# Patient Record
Sex: Male | Born: 1980
Health system: Southern US, Community
[De-identification: ages and names within clinical notes are randomized; demographics above are authoritative.]

## PROBLEM LIST (undated history)

## (undated) HISTORY — PX: FACIAL FRACTURE SURGERY: SHX1570

---

## 2007-08-15 ENCOUNTER — Emergency Department (HOSPITAL_COMMUNITY): Admission: EM | Admit: 2007-08-15 | Discharge: 2007-08-15 | Payer: Self-pay | Admitting: Emergency Medicine

## 2009-08-19 ENCOUNTER — Emergency Department (HOSPITAL_COMMUNITY): Admission: EM | Admit: 2009-08-19 | Discharge: 2009-08-19 | Payer: Self-pay | Admitting: Emergency Medicine

## 2009-09-02 ENCOUNTER — Ambulatory Visit (HOSPITAL_COMMUNITY): Admission: RE | Admit: 2009-09-02 | Discharge: 2009-09-03 | Payer: Self-pay | Admitting: Otolaryngology

## 2010-11-09 LAB — CBC
HCT: 45.3 % (ref 39.0–52.0)
Hemoglobin: 15.6 g/dL (ref 13.0–17.0)
MCHC: 34.6 g/dL (ref 30.0–36.0)
MCV: 87.2 fL (ref 78.0–100.0)
Platelets: 294 10*3/uL (ref 150–400)
WBC: 6.3 10*3/uL (ref 4.0–10.5)

## 2011-08-30 ENCOUNTER — Emergency Department (HOSPITAL_COMMUNITY)
Admission: EM | Admit: 2011-08-30 | Discharge: 2011-08-31 | Disposition: A | Discharge status: Home / Self Care | Payer: Self-pay | Attending: Emergency Medicine | Admitting: Emergency Medicine

## 2011-08-30 ENCOUNTER — Emergency Department (HOSPITAL_COMMUNITY): Payer: Self-pay

## 2011-08-30 DIAGNOSIS — S61459A Open bite of unspecified hand, initial encounter: Secondary | ICD-10-CM

## 2011-08-30 DIAGNOSIS — F17200 Nicotine dependence, unspecified, uncomplicated: Secondary | ICD-10-CM | POA: Insufficient documentation

## 2011-08-30 DIAGNOSIS — S92309A Fracture of unspecified metatarsal bone(s), unspecified foot, initial encounter for closed fracture: Secondary | ICD-10-CM | POA: Insufficient documentation

## 2011-08-30 DIAGNOSIS — Y9229 Other specified public building as the place of occurrence of the external cause: Secondary | ICD-10-CM | POA: Insufficient documentation

## 2011-08-30 DIAGNOSIS — S61409A Unspecified open wound of unspecified hand, initial encounter: Secondary | ICD-10-CM | POA: Insufficient documentation

## 2011-08-30 MED ORDER — AMOXICILLIN-POT CLAVULANATE 875-125 MG PO TABS: 1.0000 | ORAL_TABLET | Freq: Two times a day (BID) | ORAL | Status: AC

## 2011-08-30 MED ORDER — AMOXICILLIN-POT CLAVULANATE 875-125 MG PO TABS
1.0000 | ORAL_TABLET | Freq: Once | ORAL | Status: AC
  Administered 2011-08-30: 1 via ORAL
  Filled 2011-08-30: qty 1

## 2011-08-30 MED ORDER — OXYCODONE-ACETAMINOPHEN 5-325 MG PO TABS: 2.0000 | ORAL_TABLET | ORAL | Status: AC | PRN

## 2011-08-30 MED ORDER — OXYCODONE-ACETAMINOPHEN 5-325 MG PO TABS
1.0000 | ORAL_TABLET | Freq: Once | ORAL | Status: AC
  Administered 2011-08-30: 1 via ORAL
  Filled 2011-08-30: qty 1
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
## 2011-08-30 NOTE — ED Notes (Signed)
Dr.Knapp at bedside  
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
## 2011-08-30 NOTE — ED Notes (Signed)
Right hand with swelling and bruising noted across hand, c/o pain and numbness at times to right 4th finger

## 2011-08-30 NOTE — ED Notes (Signed)
Patient transported to X-ray 

## 2011-09-02 ENCOUNTER — Ambulatory Visit: Payer: Self-pay | Admitting: Orthopedic Surgery

## 2011-09-02 ENCOUNTER — Encounter: Payer: Self-pay | Admitting: Orthopedic Surgery

## 2013-04-28 ENCOUNTER — Encounter (HOSPITAL_COMMUNITY): Payer: Self-pay | Admitting: Emergency Medicine

## 2013-04-28 ENCOUNTER — Emergency Department (HOSPITAL_COMMUNITY)
Admission: EM | Admit: 2013-04-28 | Discharge: 2013-04-28 | Disposition: A | Discharge status: Home / Self Care | Payer: Self-pay | Attending: Emergency Medicine | Admitting: Emergency Medicine

## 2013-04-28 DIAGNOSIS — Y939 Activity, unspecified: Secondary | ICD-10-CM | POA: Insufficient documentation

## 2013-04-28 DIAGNOSIS — S81009A Unspecified open wound, unspecified knee, initial encounter: Secondary | ICD-10-CM | POA: Insufficient documentation

## 2013-04-28 DIAGNOSIS — F17200 Nicotine dependence, unspecified, uncomplicated: Secondary | ICD-10-CM | POA: Insufficient documentation

## 2013-04-28 DIAGNOSIS — Y929 Unspecified place or not applicable: Secondary | ICD-10-CM | POA: Insufficient documentation

## 2013-04-28 DIAGNOSIS — S81811A Laceration without foreign body, right lower leg, initial encounter: Secondary | ICD-10-CM

## 2013-04-28 DIAGNOSIS — W268XXA Contact with other sharp object(s), not elsewhere classified, initial encounter: Secondary | ICD-10-CM | POA: Insufficient documentation

## 2013-04-28 MED ORDER — TETANUS-DIPHTH-ACELL PERTUSSIS 5-2.5-18.5 LF-MCG/0.5 IM SUSP
0.5000 mL | Freq: Once | INTRAMUSCULAR | Status: AC
  Administered 2013-04-28: 0.5 mL via INTRAMUSCULAR

## 2013-04-28 MED ORDER — TETANUS-DIPHTH-ACELL PERTUSSIS 5-2.5-18.5 LF-MCG/0.5 IM SUSP
INTRAMUSCULAR | Status: AC
  Administered 2013-04-28: 0.5 mL via INTRAMUSCULAR
  Filled 2013-04-28: qty 0.5

## 2013-04-28 MED ORDER — HYDROCODONE-ACETAMINOPHEN 5-325 MG PO TABS: 1.0000 | ORAL_TABLET | ORAL | Status: DC | PRN

## 2013-04-28 MED ORDER — BACITRACIN-NEOMYCIN-POLYMYXIN OINTMENT TUBE
TOPICAL_OINTMENT | Freq: Once | CUTANEOUS | Status: AC
  Administered 2013-04-28: 1 via TOPICAL
  Filled 2013-04-28: qty 15

## 2013-04-28 MED ORDER — BACITRACIN-NEOMYCIN-POLYMYXIN 400-5-5000 EX OINT
TOPICAL_OINTMENT | CUTANEOUS | Status: AC
  Administered 2013-04-28: 1 via TOPICAL
  Filled 2013-04-28: qty 1

## 2013-04-28 MED ORDER — CEPHALEXIN 500 MG PO CAPS
500.0000 mg | ORAL_CAPSULE | Freq: Once | ORAL | Status: AC
  Administered 2013-04-28: 500 mg via ORAL
  Filled 2013-04-28: qty 1

## 2013-04-28 MED ORDER — CEPHALEXIN 500 MG PO CAPS: 500.0000 mg | ORAL_CAPSULE | Freq: Four times a day (QID) | ORAL | Status: DC
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
## 2013-05-04 NOTE — ED Provider Notes (Signed)
Medical screening examination/treatment/procedure(s) were performed by non-physician practitioner and as supervising physician I was immediately available for consultation/collaboration.   Glynn Octave, MD 05/04/13 1231

## 2013-12-12 ENCOUNTER — Emergency Department (HOSPITAL_COMMUNITY): Payer: Self-pay

## 2013-12-12 ENCOUNTER — Emergency Department (HOSPITAL_COMMUNITY)
Admission: EM | Admit: 2013-12-12 | Discharge: 2013-12-12 | Disposition: A | Discharge status: Home / Self Care | Payer: Self-pay | Attending: Emergency Medicine | Admitting: Emergency Medicine

## 2013-12-12 ENCOUNTER — Encounter (HOSPITAL_COMMUNITY): Payer: Self-pay | Admitting: Emergency Medicine

## 2013-12-12 DIAGNOSIS — Y929 Unspecified place or not applicable: Secondary | ICD-10-CM | POA: Insufficient documentation

## 2013-12-12 DIAGNOSIS — S62319A Displaced fracture of base of unspecified metacarpal bone, initial encounter for closed fracture: Secondary | ICD-10-CM | POA: Insufficient documentation

## 2013-12-12 DIAGNOSIS — IMO0002 Reserved for concepts with insufficient information to code with codable children: Secondary | ICD-10-CM | POA: Insufficient documentation

## 2013-12-12 DIAGNOSIS — F17200 Nicotine dependence, unspecified, uncomplicated: Secondary | ICD-10-CM | POA: Insufficient documentation

## 2013-12-12 DIAGNOSIS — Y9389 Activity, other specified: Secondary | ICD-10-CM | POA: Insufficient documentation

## 2013-12-12 DIAGNOSIS — S62309A Unspecified fracture of unspecified metacarpal bone, initial encounter for closed fracture: Secondary | ICD-10-CM

## 2013-12-12 MED ORDER — IBUPROFEN 800 MG PO TABS
800.0000 mg | ORAL_TABLET | Freq: Once | ORAL | Status: AC
  Administered 2013-12-12: 800 mg via ORAL
  Filled 2013-12-12: qty 1

## 2013-12-12 MED ORDER — HYDROCODONE-ACETAMINOPHEN 5-325 MG PO TABS: 1.0000 | ORAL_TABLET | ORAL | Status: DC | PRN

## 2013-12-12 MED ORDER — HYDROCODONE-ACETAMINOPHEN 5-325 MG PO TABS
1.0000 | ORAL_TABLET | Freq: Once | ORAL | Status: AC
  Administered 2013-12-12: 1 via ORAL
  Filled 2013-12-12: qty 1
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
## 2013-12-12 NOTE — Discharge Instructions (Signed)
Boxer's Fracture You have a break (fracture) of the fifth metacarpal bone. This is commonly called a boxer's fracture. This is the bone in the hand where the little finger attaches. The fracture is in the end of that bone, closest to the little finger. It is usually caused when you hit an object with a clenched fist. Often, the knuckle is pushed down by the impact. Sometimes, the fracture rotates out of position. A boxer's fracture will usually heal within 6 weeks, if it is treated properly and protected from re-injury. Surgery is sometimes needed. A cast, splint, or bulky hand dressing may be used to protect and immobilize a boxer's fracture. Do not remove this device or dressing until your caregiver approves. Keep your hand elevated, and apply ice packs for 15-20 minutes every 2 hours, for the first 2 days. Elevation and ice help reduce swelling and relieve pain. See your caregiver, or an orthopedic specialist, for follow-up care within the next 5 days. This is to make sure your fracture is healing properly. Document Released: 08/10/2005 Document Revised: 11/02/2011 Document Reviewed: 01/28/2007 [facility] Patient Information 2014 [place], [place].

## 2013-12-12 NOTE — ED Provider Notes (Signed)
CSN: [id number]     Arrival date & time 12/12/13  [id number] History   First MD Initiated Contact with Patient 12/12/13 [phone]     Chief Complaint  Patient presents with  . Hand Pain     (Consider location/radiation/quality/duration/timing/severity/associated sxs/prior Treatment) HPI Comments: Eddie Johns is a 33 y.o. Male presenting with pain and swelling of his right lateral hand which was the result of punching a wall 2 days ago.  He denies weakness or numbness in the hand and can move his fingers with minimal pain, but attempting to make a fist is very painful. He reports similar injury and fracture in his hand approximately 15 years ago.  He has had no medicines or treatments prior to arrival for this injury.  Pain is constant, throbbing and radiates into his wrist which worsens with elevation.     The history is provided by the patient.    History reviewed. No pertinent past medical history. Past Surgical History  Procedure Laterality Date  . Facial fracture surgery     Family History  Problem Relation Age of Onset  . Diabetes Other    History  Substance Use Topics  . Smoking status: Current Every Day Smoker -- 1.00 packs/day  . Smokeless tobacco: Not on file  . Alcohol Use: Yes     Comment: social    Review of Systems  Constitutional: Negative for fever.  Musculoskeletal: Positive for arthralgias and joint swelling. Negative for myalgias.  Neurological: Negative for weakness and numbness.      Allergies  Review of patient's allergies indicates no known allergies.  Home Medications   Prior to Admission medications   Medication Sig Start Date End Date Taking? Authorizing Provider  cephALEXin (KEFLEX) 500 MG capsule Take 1 capsule (500 mg total) by mouth 4 (four) times daily. 04/28/13   Kathie DikeHobson M Bryant, PA-C  HYDROcodone-acetaminophen (NORCO/VICODIN) 5-325 MG per tablet Take 1 tablet by mouth every 4 (four) hours as needed for pain. 04/28/13   Kathie DikeHobson M Bryant, PA-C    BP 116/94  Pulse 77  Temp(Src) 97.7 F (36.5 C) (Oral)  Resp 18  Ht 5\' 6"  (1.676 m)  SpO2 100% Physical Exam  Constitutional: He appears well-developed and well-nourished.  HENT:  Head: Atraumatic.  Neck: Normal range of motion.  Cardiovascular:  Pulses equal bilaterally  Musculoskeletal:       Right hand: He exhibits decreased range of motion, bony tenderness and swelling. He exhibits normal capillary refill and no deformity. Normal sensation noted. Decreased strength noted. He exhibits no thumb/finger opposition.  Decreased strength noted in fingers secondary to pain.  Less than 3 sec cap refill.  He is most tender over his 4th and 5th metacarpals with moderate edema noted.  Neurological: He is alert. He has normal strength. He displays normal reflexes. No sensory deficit.  Skin: Skin is warm and dry.  Psychiatric: He has a normal mood and affect.    ED Course  Procedures (including critical care time) Labs Review Labs Reviewed - No data to display  Imaging Review Dg Hand Complete Right  12/12/2013   CLINICAL DATA:  Right hand pain  EXAM: RIGHT HAND - COMPLETE 3+ VIEW  COMPARISON:  DG HAND COMPLETE*R* dated 08/30/2011  FINDINGS: There is a nondisplaced fracture of the right fifth metacarpal neck with mild apex dorsal angulation. There is old posttraumatic deformity of the right third metacarpal. There is mild osteoarthritis of the right third MCP joint manifested by a marginal osteophyte of the right  third metacarpal head. There is soft tissue swelling over the right fifth metacarpal.  IMPRESSION: Nondisplaced fracture of the right fifth metacarpal neck with mild apex dorsal angulation.   Electronically Signed   By: Elige KoHetal  Patel   On: 12/12/2013 08:27     EKG Interpretation None      MDM   Final diagnoses:  Fracture of metacarpal bone    Patients labs and/or radiological studies were viewed and considered during the medical decision making and disposition process. Pt  was placed in ulner gutter splint with gentle pressure applied at the site of angulation.  Hydrocodone, ice,  Elevation,  Referral to Dr. Romeo AppleHarrison this week for recheck and cast.  The patient appears reasonably screened and/or stabilized for discharge and I doubt any other medical condition or other [facility] requiring further screening, evaluation, or treatment in the ED at this time prior to discharge.  Splint was examined post application, pain improved,  Patient can wiggle digits, less than 3 sec cap refill.      Burgess AmorJulie Donika Butner, PA-C 12/12/13 [phone]

## 2013-12-13 NOTE — ED Provider Notes (Signed)
Medical screening examination/treatment/procedure(s) were performed by non-physician practitioner and as supervising physician I was immediately available for consultation/collaboration.   EKG Interpretation None        Deantae Shackleton M Kabir Brannock, DO 12/13/13 0710 

## 2014-01-01 ENCOUNTER — Ambulatory Visit: Payer: Self-pay | Admitting: Orthopedic Surgery

## 2014-01-02 ENCOUNTER — Ambulatory Visit (INDEPENDENT_AMBULATORY_CARE_PROVIDER_SITE_OTHER): Payer: Self-pay | Admitting: Orthopedic Surgery

## 2014-01-02 ENCOUNTER — Encounter: Payer: Self-pay | Admitting: Orthopedic Surgery

## 2014-01-02 ENCOUNTER — Ambulatory Visit (INDEPENDENT_AMBULATORY_CARE_PROVIDER_SITE_OTHER): Payer: Self-pay

## 2014-01-02 VITALS — BP 128/87 | Ht 66.0 in | Wt 195.0 lb

## 2014-01-02 DIAGNOSIS — S62609A Fracture of unspecified phalanx of unspecified finger, initial encounter for closed fracture: Secondary | ICD-10-CM

## 2014-01-02 DIAGNOSIS — S62339A Displaced fracture of neck of unspecified metacarpal bone, initial encounter for closed fracture: Secondary | ICD-10-CM

## 2014-01-02 MED ORDER — HYDROCODONE-ACETAMINOPHEN 10-325 MG PO TABS: 1.0000 | ORAL_TABLET | ORAL | Status: DC | PRN

## 2014-01-02 NOTE — Patient Instructions (Signed)
Tape x 3 weeks

## 2014-01-03 DIAGNOSIS — S62339A Displaced fracture of neck of unspecified metacarpal bone, initial encounter for closed fracture: Secondary | ICD-10-CM | POA: Insufficient documentation

## 2014-01-03 NOTE — Progress Notes (Signed)
Patient ID: Eddie DykesMichael G Johns, male   DOB: 1980-10-15, 33 y.o.   MRN: [id number]  Chief Complaint  Patient presents with  . Hand Injury    Right hand fracture. DOI 12-10-13.    HISTORY: 33 year old male who  He has a previous history boxer's fracture he'll wall again sustained another boxer's fracture 4 weeks ago reaction radial this fracture is healing slight angulation. He is self pay. He complains of pain swelling numbness tingling throbbing burning constant pain 10 out of 10 unrelieved by hydrocodone but he does get some relief from the splint  Medical history negative. 2011 had reconstructive eye surgery takes no chronic medications. Her affect no allergies  Family history of stroke diabetes  Review of systems normal except for heartburn    Vital signs: BP 128/87  Ht 5\' 6"  (1.676 m)  Wt 195 lb (88.451 kg)  BMI 31.49 kg/m2   General the patient is well-developed and well-nourished grooming and hygiene are normal Oriented x3 Mood and affect normal Ambulation normal Inspection of the right hand shows swelling tenderness over the distal portion of the fifth metacarpal bone of the right hand he asked he has very good range of motion near full no rotatory deformity stability cannot be assessed grip strength is weak. Skin is intact pulses are normal and sensation is normal.  Encounter Diagnoses  Name Primary?  . Closed fracture of neck of metacarpal bone(s) Yes  . Boxer's metacarpal fracture, neck, closed     Buddy taping. Due to the patient's financial situation if he is not having any difficulty after 2-3 weeks he does not have to come back if he is advised to come back. Hopefully this will save him some money. His fracture stable should not have any displacement.

## 2014-01-10 ENCOUNTER — Telehealth: Payer: Self-pay | Admitting: Radiology

## 2014-01-10 ENCOUNTER — Other Ambulatory Visit: Payer: Self-pay | Admitting: Orthopedic Surgery

## 2014-01-10 MED ORDER — HYDROCODONE-ACETAMINOPHEN 10-325 MG PO TABS: 1.0000 | ORAL_TABLET | ORAL | Status: DC | PRN

## 2014-01-10 NOTE — Telephone Encounter (Signed)
Patient wants refill on Hydrocodone 10/325   [phone]

## 2014-01-12 ENCOUNTER — Other Ambulatory Visit: Payer: Self-pay | Admitting: Orthopedic Surgery

## 2014-01-12 MED ORDER — HYDROCODONE-ACETAMINOPHEN 5-325 MG PO TABS: 1.0000 | ORAL_TABLET | Freq: Four times a day (QID) | ORAL | Status: DC | PRN

## 2014-02-13 ENCOUNTER — Telehealth: Payer: Self-pay | Admitting: Orthopedic Surgery

## 2014-02-13 NOTE — Telephone Encounter (Signed)
Patient called requesting a refill on pain med. He was unsure of the name of med. Patient phone is [phone]

## 2014-02-19 NOTE — Telephone Encounter (Signed)
Patient requesting refill on hydrocodone 5/325 mg. Please advise.

## 2014-02-20 ENCOUNTER — Other Ambulatory Visit: Payer: Self-pay | Admitting: Orthopedic Surgery

## 2014-02-20 MED ORDER — HYDROCODONE-ACETAMINOPHEN 5-325 MG PO TABS: 1.0000 | ORAL_TABLET | Freq: Four times a day (QID) | ORAL | Status: DC | PRN

## 2014-02-20 NOTE — Telephone Encounter (Signed)
Patient aware prescription is ready for pick up

## 2014-04-17 ENCOUNTER — Telehealth: Payer: Self-pay | Admitting: Orthopedic Surgery

## 2014-04-17 NOTE — Telephone Encounter (Signed)
Eddie Johns called 12:20 today to request an appointment for a right hand injury.  Advised we have had to change our schedule this week and only have Dr. Romeo Apple here this afternoon and tomorrow.  Advised him to go to [facility] today to be evaluated and get his xray done so he can get some treatment started Because we would not be able to get him in until next week.

## 2014-05-21 ENCOUNTER — Encounter (HOSPITAL_COMMUNITY): Payer: Self-pay | Admitting: Emergency Medicine

## 2014-05-21 ENCOUNTER — Emergency Department (HOSPITAL_COMMUNITY)
Admission: EM | Admit: 2014-05-21 | Discharge: 2014-05-21 | Disposition: A | Discharge status: Home / Self Care | Payer: Self-pay | Attending: Emergency Medicine | Admitting: Emergency Medicine

## 2014-05-21 ENCOUNTER — Emergency Department (HOSPITAL_COMMUNITY): Payer: Self-pay

## 2014-05-21 DIAGNOSIS — S6990XA Unspecified injury of unspecified wrist, hand and finger(s), initial encounter: Secondary | ICD-10-CM | POA: Insufficient documentation

## 2014-05-21 DIAGNOSIS — Y9289 Other specified places as the place of occurrence of the external cause: Secondary | ICD-10-CM | POA: Insufficient documentation

## 2014-05-21 DIAGNOSIS — S60221A Contusion of right hand, initial encounter: Secondary | ICD-10-CM

## 2014-05-21 DIAGNOSIS — F17200 Nicotine dependence, unspecified, uncomplicated: Secondary | ICD-10-CM | POA: Insufficient documentation

## 2014-05-21 DIAGNOSIS — Z791 Long term (current) use of non-steroidal anti-inflammatories (NSAID): Secondary | ICD-10-CM | POA: Insufficient documentation

## 2014-05-21 DIAGNOSIS — S60229A Contusion of unspecified hand, initial encounter: Secondary | ICD-10-CM | POA: Insufficient documentation

## 2014-05-21 DIAGNOSIS — IMO0002 Reserved for concepts with insufficient information to code with codable children: Secondary | ICD-10-CM | POA: Insufficient documentation

## 2014-05-21 DIAGNOSIS — Y9389 Activity, other specified: Secondary | ICD-10-CM | POA: Insufficient documentation

## 2014-05-21 MED ORDER — HYDROCODONE-ACETAMINOPHEN 5-325 MG PO TABS
1.0000 | ORAL_TABLET | Freq: Once | ORAL | Status: AC
  Administered 2014-05-21: 1 via ORAL
  Filled 2014-05-21: qty 1

## 2014-05-21 MED ORDER — HYDROCODONE-ACETAMINOPHEN 5-325 MG PO TABS: ORAL_TABLET | ORAL | Status: DC

## 2014-05-21 MED ORDER — IBUPROFEN 800 MG PO TABS: 800.0000 mg | ORAL_TABLET | Freq: Three times a day (TID) | ORAL | Status: DC

## 2014-05-21 NOTE — Discharge Instructions (Signed)
Contusion A contusion is a deep bruise. Contusions happen when an injury causes bleeding under the skin. Signs of bruising include pain, puffiness (swelling), and discolored skin. The contusion may turn blue, purple, or yellow. HOME CARE   Put ice on the injured area.  Put ice in a plastic bag.  Place a towel between your skin and the bag.  Leave the ice on for 15-20 minutes, 03-04 times a day.  Only take medicine as told by your doctor.  Rest the injured area.  If possible, raise (elevate) the injured area to lessen puffiness. GET HELP RIGHT AWAY IF:   You have more bruising or puffiness.  You have pain that is getting worse.  Your puffiness or pain is not helped by medicine. MAKE SURE YOU:   Understand these instructions.  Will watch your condition.  Will get help right away if you are not doing well or get worse. Document Released: 01/27/2008 Document Revised: 11/02/2011 Document Reviewed: 06/15/2011 [facility] Patient Information 2015 [place], [place]. This information is not intended to replace advice given to you by your health care provider. Make sure you discuss any questions you have with your health care provider.  Hand Contusion  A hand contusion is a deep bruise to the hand. Contusions happen when an injury causes bleeding under the skin. Signs of bruising include pain, puffiness (swelling), and discolored skin. The contusion may turn blue, purple, or yellow. HOME CARE  Put ice on the injured area.  Put ice in a plastic bag.  Place a towel between your skin and the bag.  Leave the ice on for 15-20 minutes, 03-04 times a day.  Only take medicines as told by your doctor.  Use an elastic wrap only as told. You may remove the wrap for sleeping, showering, and bathing. Take the wrap off if you lose feeling (have numbness) in your fingers, or they turn blue or cold. Put the wrap on more loosely.  Keep the hand raised (elevated) with pillows.  Avoid using your  hand too much if it is painful. GET HELP RIGHT AWAY IF:   You have more redness, puffiness, or pain in your hand.  Your puffiness or pain does not get better with medicine.  You lose feeling in your hand, or you cannot move your fingers.  Your hand turns cold or blue.  You have pain when you move your fingers.  Your hand feels warm.  Your contusion does not get better in 2 days. MAKE SURE YOU:   Understand these instructions.  Will watch this condition.  Will get help right away if you are not doing well or you get worse. Document Released: 01/27/2008 Document Revised: 12/25/2013 Document Reviewed: 02/01/2012 [facility] Patient Information 2015 [place], [place]. This information is not intended to replace advice given to you by your health care provider. Make sure you discuss any questions you have with your health care provider.

## 2014-05-21 NOTE — ED Notes (Signed)
Pt c/o right hand pain after closing it in car door today; pt has positive radial pulse; pt has swelling to right hand

## 2014-05-21 NOTE — ED Notes (Signed)
Rt hand swollen, ice pack applied.  3 and 4th fingers buddy taped, ace wrap.

## 2014-05-23 NOTE — ED Provider Notes (Signed)
CSN: [id number]     Arrival date & time 05/21/14  1934 History   First MD Initiated Contact with Patient 05/21/14 2130     Chief Complaint  Patient presents with  . Hand Injury     (Consider location/radiation/quality/duration/timing/severity/associated sxs/prior Treatment) HPI  Eddie Johns is a 33 y.o. male who presents to the Emergency Department complaining of pain to his right hand after a direct blow.  He states that he accidentally shut the car door on his hand.  He reports a previous fracture to the same hand and is concerned that he may have re injured the hand.  He reports immediate swelling ot his hand and pain with movement of the third and fourth fingers.  He has not tried any therapies prior to arrival.  He also denies open wounds, numbness or weakness of the hand or pain to the wrist.     History reviewed. No pertinent past medical history. Past Surgical History  Procedure Laterality Date  . Facial fracture surgery     Family History  Problem Relation Age of Onset  . Diabetes Other    History  Substance Use Topics  . Smoking status: Current Every Day Smoker -- 1.00 packs/day  . Smokeless tobacco: Not on file  . Alcohol Use: Yes     Comment: social    Review of Systems  Constitutional: Negative for fever and chills.  Genitourinary: Negative for dysuria and difficulty urinating.  Musculoskeletal: Positive for arthralgias and joint swelling.       Right hand pain and swelling  Skin: Negative for color change and wound.  All other systems reviewed and are negative.     Allergies  Review of patient's allergies indicates no known allergies.  Home Medications   Prior to Admission medications   Medication Sig Start Date End Date Taking? Authorizing Provider  tetrahydrozoline (VISINE) 0.05 % ophthalmic solution Place 1 drop into both eyes daily as needed (for dry eye/irritation).   Yes Historical Provider, MD  HYDROcodone-acetaminophen (NORCO/VICODIN) 5-325  MG per tablet Take one-two tabs po q 4-6 hrs prn pain 05/21/14   Michole Lecuyer L. Terryn Rosenkranz, PA-C  ibuprofen (ADVIL,MOTRIN) 800 MG tablet Take 1 tablet (800 mg total) by mouth 3 (three) times daily. 05/21/14   Vicie Cech L. Trysten Berti, PA-C   BP 128/80  Pulse 86  Temp(Src) 99.1 F (37.3 C) (Oral)  Resp 20  Ht 5\' 6"  (1.676 m)  Wt 180 lb (81.647 kg)  BMI 29.07 kg/m2  SpO2 100% Physical Exam  Nursing note and vitals reviewed. Constitutional: He is oriented to person, place, and time. He appears well-developed and well-nourished. No distress.  HENT:  Head: Normocephalic and atraumatic.  Cardiovascular: Normal rate, regular rhythm, normal heart sounds and intact distal pulses.   No murmur heard. Pulmonary/Chest: Effort normal and breath sounds normal. No respiratory distress.  Musculoskeletal: He exhibits edema and tenderness.  Localized ttp of the dorsal right hand at the proximal third and fourth fingers.  Moderate STS present.   Radial pulse is brisk, distal sensation intact.  CR< 2 sec.  No bruising or bony deformity.  No proximal tenderness or edema.  Compartments of the right UE are soft.  Neurological: He is alert and oriented to person, place, and time. He exhibits normal muscle tone. Coordination normal.  Skin: Skin is warm and dry.    ED Course  Procedures (including critical care time) Labs Review Labs Reviewed - No data to display  Imaging Review Dg Hand Complete Right  05/21/2014   CLINICAL DATA:  Slammed right hand in car door. Injury to fourth finger.  EXAM: RIGHT HAND - COMPLETE 3+ VIEW  COMPARISON:  01/02/2014  FINDINGS: Evidence of old third and fifth metacarpal fractures. No evidence of acute fracture or dislocation.  IMPRESSION: No acute findings.   Electronically Signed   By: Elberta Fortis M.D.   On: 05/21/2014 20:43    EKG Interpretation None      MDM   Final diagnoses:  Contusion, hand, right, initial encounter    Bulking dressing applied, third and fourth fingers were  splint for comfort, pain improved , remains NV intact.   Patient agrees to elevate, ice and orthopedic f/u in one week if sx's not improving.  Pt agrees to plan and appears stable for d/c    Tiffine Henigan L. Mischa Brittingham, PA-C 05/23/14 2317

## 2014-05-24 ENCOUNTER — Ambulatory Visit (INDEPENDENT_AMBULATORY_CARE_PROVIDER_SITE_OTHER): Payer: Self-pay | Admitting: Orthopedic Surgery

## 2014-05-24 VITALS — BP 125/90 | Ht 66.0 in | Wt 180.0 lb

## 2014-05-24 DIAGNOSIS — S60221A Contusion of right hand, initial encounter: Secondary | ICD-10-CM

## 2014-05-24 MED ORDER — OXYCODONE-ACETAMINOPHEN 5-325 MG PO TABS: 1.0000 | ORAL_TABLET | ORAL | Status: DC | PRN

## 2014-05-24 MED ORDER — IBUPROFEN 800 MG PO TABS: 800.0000 mg | ORAL_TABLET | Freq: Three times a day (TID) | ORAL | Status: DC

## 2014-05-24 NOTE — ED Provider Notes (Signed)
Medical screening examination/treatment/procedure(s) were performed by non-physician practitioner and as supervising physician I was immediately available for consultation/collaboration.   EKG Interpretation None        Shaquanta Harkless, MD 05/24/14 0701 

## 2014-05-24 NOTE — Patient Instructions (Signed)
Take meds as ordered   Move fingers

## 2014-05-24 NOTE — Progress Notes (Signed)
Subjective:   Chief Complaint  Patient presents with  . Hand Injury    reinjury of right hand     Eddie Johns is a 33 y.o. male who presents for evaluation of right hand/finger pain. Onset was Sudden after his hand was closed in a car door on September 28. The pain is severe, worsens with movement, and is relieved by nothing yet including oral pain medication. There is no associated numbness, tingling in and. Evaluation to date: Emergency room x-rays negative for acute fracture. Treatment to date: ice, Buddy taping and oral pain medicine.  The following portions of the patient's history were reviewed and updated as appropriate: allergies, current medications, past family history, past medical history, past social history, past surgical history and problem list.  Review of Systems A comprehensive review of systems was negative.    Objective:    BP 125/90  Ht 5\' 6"  (1.676 m)  Wt 180 lb (81.647 kg)  BMI 29.07 kg/m2       General appearance is normal. Orientation to person place and time normal. Mood and affect normal. Gait and station normal. Right hand evaluation has swelling on the dorsum of the hand and it involves the third and fourth metacarpals. No deformity is seen. Has restricted range of motion secondary to pain and swelling. The metacarpophalangeal joints are stable strength is weakened by the pain the skin is intact his pulses couldn't radial artery has good capillary refill and there are no sensory deficits Imaging: X-ray right hand: soft tissue swelling    Assessment:    Severe contusion right hand    Plan:    Active range of motion, ibuprofen and Percocet followup when necessary

## 2014-05-30 ENCOUNTER — Other Ambulatory Visit: Payer: Self-pay | Admitting: *Deleted

## 2014-05-30 ENCOUNTER — Telehealth: Payer: Self-pay | Admitting: Orthopedic Surgery

## 2014-05-30 DIAGNOSIS — S60221A Contusion of right hand, initial encounter: Secondary | ICD-10-CM

## 2014-05-30 MED ORDER — OXYCODONE-ACETAMINOPHEN 5-325 MG PO TABS: 1.0000 | ORAL_TABLET | ORAL | Status: DC | PRN

## 2014-05-30 NOTE — Telephone Encounter (Signed)
Patient called, requests pain medication refill, HYDROcodone-acetaminophen (NORCO/VICODIN) 5-325 MG per tablet [[id number]][[id number]]. He is to follow up as needed, per 05/24/14 office visit.  Ph# [phone].

## 2014-05-31 NOTE — Telephone Encounter (Signed)
Prescription available for pick up, called patient, no answer, left vm 

## 2014-06-06 NOTE — Telephone Encounter (Signed)
Patient had been by and picked up prescription 06/01/14.

## 2014-06-14 ENCOUNTER — Telehealth: Payer: Self-pay | Admitting: *Deleted

## 2014-06-14 NOTE — Telephone Encounter (Signed)
Routing to Dr Romeo AppleHarrison This was a hand contusion with no follow up

## 2014-06-14 NOTE — Telephone Encounter (Signed)
Patient called requesting refill on oxycodone 5/325 mg. Also, he is wanting to speak with the nurse regarding his hand swelling. Patient's number [phone].

## 2014-06-18 NOTE — Telephone Encounter (Signed)
FINDINGS:  Evidence of old third and fifth metacarpal fractures. No evidence of  acute fracture or dislocation.  IMPRESSION:  No acute findings.  Electronically Signed  By: Elberta Fortisaniel Boyle M.D.  On: 05/21/2014 20:43 I have reviewed the patient's clinical history and x-rays. The patient can take Tylenol or ibuprofen apply ice as needed and elevate the hand as needed.

## 2014-06-18 NOTE — Telephone Encounter (Signed)
Called patient, not available, left message to return call 

## 2016-05-14 ENCOUNTER — Encounter: Payer: Self-pay | Admitting: Family

## 2016-05-14 ENCOUNTER — Ambulatory Visit (INDEPENDENT_AMBULATORY_CARE_PROVIDER_SITE_OTHER): Payer: BLUE CROSS/BLUE SHIELD | Admitting: Family

## 2016-05-14 VITALS — BP 113/70 | Temp 98.4°F | Ht 66.0 in | Wt 190.2 lb

## 2016-05-14 DIAGNOSIS — E669 Obesity, unspecified: Secondary | ICD-10-CM | POA: Diagnosis not present

## 2016-05-14 DIAGNOSIS — Z0000 Encounter for general adult medical examination without abnormal findings: Secondary | ICD-10-CM

## 2016-05-14 DIAGNOSIS — Z114 Encounter for screening for human immunodeficiency virus [HIV]: Secondary | ICD-10-CM | POA: Diagnosis not present

## 2016-05-14 DIAGNOSIS — M79604 Pain in right leg: Secondary | ICD-10-CM | POA: Diagnosis not present

## 2016-05-14 MED ORDER — NAPROXEN 500 MG PO TABS: 500.0000 mg | ORAL_TABLET | Freq: Two times a day (BID) | ORAL | 1 refills | Status: DC

## 2016-05-14 NOTE — Progress Notes (Signed)
   Subjective:    Patient ID: Eddie Johns, male    DOB: 06-14-1981, 35 y.o.   MRN: [id number]  HPI Pt presents to the office today to establish care. PT is currently not taking any medications at this time. Pt states he is having intermittent right lower leg pain that is a 8-9 out 10. PT states in 2014 he has an laceration to that area. Pt denies any headache, palpitations, SOB, or edema at this time.     Review of Systems  Musculoskeletal: Positive for arthralgias (rightl lower leg).  All other systems reviewed and are negative.      Objective:   Physical Exam  Constitutional: He is oriented to person, place, and time. He appears well-developed and well-nourished. No distress.  HENT:  Head: Normocephalic.  Right Ear: External ear normal.  Left Ear: External ear normal.  Nose: Nose normal.  Mouth/Throat: Oropharynx is clear and moist.  Eyes: Pupils are equal, round, and reactive to light. Right eye exhibits no discharge. Left eye exhibits no discharge.  Neck: Normal range of motion. Neck supple. No thyromegaly present.  Cardiovascular: Normal rate, regular rhythm, normal heart sounds and intact distal pulses.   No murmur heard. Pulmonary/Chest: Effort normal and breath sounds normal. No respiratory distress. He has no wheezes.  Abdominal: Soft. Bowel sounds are normal. He exhibits no distension. There is no tenderness.  Musculoskeletal: Normal range of motion. He exhibits no edema or tenderness.  Neurological: He is alert and oriented to person, place, and time. He has normal reflexes. No cranial nerve deficit.  Skin: Skin is warm and dry. No rash noted. No erythema.  Psychiatric: He has a normal mood and affect. His behavior is normal. Judgment and thought content normal.  Vitals reviewed.     BP 113/70   Temp 98.4 F (36.9 C) (Oral)   Ht '5\' 6"'$  (1.676 m)   Wt 190 lb 3.2 oz (86.3 kg)   BMI 30.70 kg/m      Assessment & Plan:  1. Annual physical exam -  CMP14+EGFR - Lipid panel - CBC with Differential/Platelet - Thyroid Panel With TSH - VITAMIN D 25 Hydroxy (Vit-D Deficiency, Fractures) - HIV antibody  2. Obesity (BMI 30-39.9) - CMP14+EGFR  3. Encounter for screening for HIV - CMP14+EGFR - HIV antibody  4. Pain of right lower extremity - CMP14+EGFR - naproxen (NAPROSYN) 500 MG tablet; Take 1 tablet (500 mg total) by mouth 2 (two) times daily with a meal.  Dispense: 60 tablet; Refill: 1   Continue all meds Labs pending Health Maintenance reviewed Diet and exercise encouraged RTO 1 year  Evelina Dun, FNP

## 2016-05-14 NOTE — Patient Instructions (Signed)

## 2016-05-15 ENCOUNTER — Other Ambulatory Visit: Payer: Self-pay | Admitting: Family

## 2016-05-15 DIAGNOSIS — E559 Vitamin D deficiency, unspecified: Secondary | ICD-10-CM

## 2016-05-15 LAB — CBC WITH DIFFERENTIAL/PLATELET
Basophils Absolute: 0 10*3/uL (ref 0.0–0.2)
Basos: 0 %
EOS (ABSOLUTE): 0.5 10*3/uL — ABNORMAL HIGH (ref 0.0–0.4)
Eos: 4 %
HEMOGLOBIN: 14.5 g/dL (ref 12.6–17.7)
Hematocrit: 42.8 % (ref 37.5–51.0)
Immature Grans (Abs): 0 10*3/uL (ref 0.0–0.1)
Immature Granulocytes: 0 %
Lymphocytes Absolute: 3.5 10*3/uL — ABNORMAL HIGH (ref 0.7–3.1)
Lymphs: 30 %
MCH: 28.2 pg (ref 26.6–33.0)
MCHC: 33.9 g/dL (ref 31.5–35.7)
MCV: 83 fL (ref 79–97)
MONOCYTES: 6 %
Monocytes Absolute: 0.8 10*3/uL (ref 0.1–0.9)
NEUTROS ABS: 6.9 10*3/uL (ref 1.4–7.0)
Neutrophils: 60 %
Platelets: 384 10*3/uL — ABNORMAL HIGH (ref 150–379)
RBC: 5.14 x10E6/uL (ref 4.14–5.80)
RDW: 14.3 % (ref 12.3–15.4)
WBC: 11.6 10*3/uL — ABNORMAL HIGH (ref 3.4–10.8)

## 2016-05-15 LAB — CMP14+EGFR
ALT: 13 IU/L (ref 0–44)
AST: 14 IU/L (ref 0–40)
Albumin/Globulin Ratio: 1.7 (ref 1.2–2.2)
Albumin: 4.7 g/dL (ref 3.5–5.5)
Alkaline Phosphatase: 96 IU/L (ref 39–117)
BUN/Creatinine Ratio: 12 (ref 9–20)
BUN: 12 mg/dL (ref 6–20)
Bilirubin Total: 0.4 mg/dL (ref 0.0–1.2)
CALCIUM: 10.6 mg/dL — AB (ref 8.7–10.2)
CO2: 23 mmol/L (ref 18–29)
Chloride: 100 mmol/L (ref 96–106)
Creatinine, Ser: 1 mg/dL (ref 0.76–1.27)
GFR calc Af Amer: 112 mL/min/{1.73_m2} (ref >59)
GFR calc non Af Amer: 97 mL/min/{1.73_m2} (ref >59)
GLUCOSE: 79 mg/dL (ref 65–99)
Globulin, Total: 2.8 g/dL (ref 1.5–4.5)
POTASSIUM: 3.8 mmol/L (ref 3.5–5.2)
Sodium: 139 mmol/L (ref 134–144)
Total Protein: 7.5 g/dL (ref 6.0–8.5)

## 2016-05-15 LAB — THYROID PANEL WITH TSH
Free Thyroxine Index: 1.6 (ref 1.2–4.9)
T3 Uptake Ratio: 25 % (ref 24–39)
T4, Total: 6.3 ug/dL (ref 4.5–12.0)
TSH: 2.55 u[IU]/mL (ref 0.450–4.500)

## 2016-05-15 LAB — LIPID PANEL
Chol/HDL Ratio: 2.8 ratio units (ref 0.0–5.0)
Cholesterol, Total: 170 mg/dL (ref 100–199)
HDL: 61 mg/dL (ref >39)
LDL Calculated: 82 mg/dL (ref 0–99)
TRIGLYCERIDES: 135 mg/dL (ref 0–149)
VLDL Cholesterol Cal: 27 mg/dL (ref 5–40)

## 2016-05-15 LAB — HIV ANTIBODY (ROUTINE TESTING W REFLEX): HIV Screen 4th Generation wRfx: NONREACTIVE

## 2016-05-15 LAB — VITAMIN D 25 HYDROXY (VIT D DEFICIENCY, FRACTURES): Vit D, 25-Hydroxy: 6.3 ng/mL — ABNORMAL LOW (ref 30.0–100.0)

## 2016-05-15 MED ORDER — VITAMIN D (ERGOCALCIFEROL) 1.25 MG (50000 UNIT) PO CAPS: 50000.0000 [IU] | ORAL_CAPSULE | ORAL | 3 refills | Status: DC

## 2016-09-01 ENCOUNTER — Telehealth: Payer: Self-pay | Admitting: Family

## 2016-09-04 ENCOUNTER — Ambulatory Visit (INDEPENDENT_AMBULATORY_CARE_PROVIDER_SITE_OTHER): Payer: BLUE CROSS/BLUE SHIELD | Admitting: Family

## 2016-09-04 ENCOUNTER — Encounter: Payer: Self-pay | Admitting: Family

## 2016-09-04 VITALS — BP 129/81 | HR 90 | Temp 99.7°F | Ht 66.0 in | Wt 184.0 lb

## 2016-09-04 DIAGNOSIS — G8929 Other chronic pain: Secondary | ICD-10-CM | POA: Diagnosis not present

## 2016-09-04 DIAGNOSIS — Z0289 Encounter for other administrative examinations: Secondary | ICD-10-CM

## 2016-09-04 DIAGNOSIS — M79641 Pain in right hand: Secondary | ICD-10-CM

## 2016-09-04 DIAGNOSIS — F1120 Opioid dependence, uncomplicated: Secondary | ICD-10-CM

## 2016-09-04 DIAGNOSIS — F17200 Nicotine dependence, unspecified, uncomplicated: Secondary | ICD-10-CM | POA: Diagnosis not present

## 2016-09-04 MED ORDER — TRAMADOL HCL 50 MG PO TABS: 50.0000 mg | ORAL_TABLET | Freq: Two times a day (BID) | ORAL | 2 refills | Status: DC | PRN

## 2016-09-04 NOTE — Progress Notes (Signed)
   Subjective:    Patient ID: Eddie Johns, male    DOB: Sep 28, 1980, 36 y.o.   MRN: [id number]  PT presents to the office today with recurrent right hand pain that started several years ago. PT fractured his third and fifth metacarpal in 1998 and has since had intermittent aching pain of 9 out 10. Pt states at his job he has to constantly use that hand. Pt states he has tried naproxen with no relief.  Hand Pain   The pain is present in the right hand. The quality of the pain is described as aching. The pain is at a severity of 9/10. The pain is moderate. The pain has been intermittent since the incident. He has tried rest for the symptoms. The treatment provided mild relief.    *Pt reviewed in [place] controlled database, pt has not received any controlled substances recently.   Review of Systems  All other systems reviewed and are negative.      Objective:   Physical Exam  Constitutional: He is oriented to person, place, and time. He appears well-developed and well-nourished. No distress.  HENT:  Head: Normocephalic.  Eyes: Pupils are equal, round, and reactive to light. Right eye exhibits no discharge. Left eye exhibits no discharge.  Neck: Normal range of motion. Neck supple. No thyromegaly present.  Cardiovascular: Normal rate, regular rhythm, normal heart sounds and intact distal pulses.   No murmur heard. Pulmonary/Chest: Effort normal and breath sounds normal. No respiratory distress. He has no wheezes.  Abdominal: Soft. Bowel sounds are normal. He exhibits no distension. There is no tenderness.  Musculoskeletal: Normal range of motion. He exhibits edema (right knuckle on first metacarpal enlarged) and tenderness.  Neurological: He is alert and oriented to person, place, and time. He has normal reflexes. No cranial nerve deficit.  Skin: Skin is warm and dry. No rash noted. No erythema.  Psychiatric: He has a normal mood and affect. His behavior is normal. Judgment and thought  content normal.  Vitals reviewed.    BP 129/81   Pulse 90   Temp 99.7 F (37.6 C) (Oral)   Ht 5\' 6"  (1.676 m)   Wt 184 lb (83.5 kg)   BMI 29.70 kg/m       Assessment & Plan:  1. Chronic hand pain, right - traMADol (ULTRAM) 50 MG tablet; Take 1-2 tablets (50-100 mg total) by mouth every 12 (twelve) hours as needed.  Dispense: 90 tablet; Refill: 2 - ToxASSURE Select 13 (MW), Urine  2. Current smoker -Smoking cessation discussed - traMADol (ULTRAM) 50 MG tablet; Take 1-2 tablets (50-100 mg total) by mouth every 12 (twelve) hours as needed.  Dispense: 90 tablet; Refill: 2 - ToxASSURE Select 13 (MW), Urine  3. Pain medication agreement signed - traMADol (ULTRAM) 50 MG tablet; Take 1-2 tablets (50-100 mg total) by mouth every 12 (twelve) hours as needed.  Dispense: 90 tablet; Refill: 2 - ToxASSURE Select 13 (MW), Urine  4. Uncomplicated opioid dependence (HCC) - traMADol (ULTRAM) 50 MG tablet; Take 1-2 tablets (50-100 mg total) by mouth every 12 (twelve) hours as needed.  Dispense: 90 tablet; Refill: 2 - ToxASSURE Select 13 (MW), Urine   Continue all meds Labs pending Health Maintenance reviewed Diet and exercise encouraged RTO 3 months   Jannifer Rodneyhristy Keshana Klemz, FNP

## 2016-09-04 NOTE — Patient Instructions (Signed)
Arthritis Introduction Arthritis is a term that is commonly used to refer to joint pain or joint disease. There are more than 100 types of arthritis. What are the causes? The most common cause of this condition is wear and tear of a joint. Other causes include:  Gout.  Inflammation of a joint.  An infection of a joint.  Sprains and other injuries near the joint.  A drug reaction or allergic reaction. In some cases, the cause may not be known. What are the signs or symptoms? The main symptom of this condition is pain in the joint with movement. Other symptoms include:  Redness, swelling, or stiffness at a joint.  Warmth coming from the joint.  Fever.  Overall feeling of illness. How is this diagnosed? This condition may be diagnosed with a physical exam and tests, including:  Blood tests.  Urine tests.  Imaging tests, such as MRI, X-rays, or a CT scan. Sometimes, fluid is removed from a joint for testing. How is this treated? Treatment for this condition may involve:  Treatment of the cause, if it is known.  Rest.  Raising (elevating) the joint.  Applying cold or hot packs to the joint.  Medicines to improve symptoms and reduce inflammation.  Injections of a steroid such as cortisone into the joint to help reduce pain and inflammation. Depending on the cause of your arthritis, you may need to make lifestyle changes to reduce stress on your joint. These changes may include exercising more and losing weight. Follow these instructions at home: Medicines  Take over-the-counter and prescription medicines only as told by your health care provider.  Do not take aspirin to relieve pain if gout is suspected. Activity  Rest your joint if told by your health care provider. Rest is important when your disease is active and your joint feels painful, swollen, or stiff.  Avoid activities that make the pain worse. It is important to balance activity with rest.  Exercise  your joint regularly with range-of-motion exercises as told by your health care provider. Try doing low-impact exercise, such as:  Swimming.  Water aerobics.  Biking.  Walking. Joint Care   If your joint is swollen, keep it elevated if told by your health care provider.  If your joint feels stiff in the morning, try taking a warm shower.  If directed, apply heat to the joint. If you have diabetes, do not apply heat without permission from your health care provider.  Put a towel between the joint and the hot pack or heating pad.  Leave the heat on the area for 20-30 minutes.  If directed, apply ice to the joint:  Put ice in a plastic bag.  Place a towel between your skin and the bag.  Leave the ice on for 20 minutes, 2-3 times per day.  Keep all follow-up visits as told by your health care provider. This is important. Contact a health care provider if:  The pain gets worse.  You have a fever. Get help right away if:  You develop severe joint pain, swelling, or redness.  Many joints become painful and swollen.  You develop severe back pain.  You develop severe weakness in your leg.  You cannot control your bladder or bowels. This information is not intended to replace advice given to you by your health care provider. Make sure you discuss any questions you have with your health care provider. Document Released: 09/17/2004 Document Revised: 01/16/2016 Document Reviewed: 11/05/2014  2017 Elsevier  

## 2016-09-07 DIAGNOSIS — F4325 Adjustment disorder with mixed disturbance of emotions and conduct: Secondary | ICD-10-CM | POA: Diagnosis not present

## 2016-10-01 DIAGNOSIS — F4325 Adjustment disorder with mixed disturbance of emotions and conduct: Secondary | ICD-10-CM | POA: Diagnosis not present

## 2016-11-19 DIAGNOSIS — F4323 Adjustment disorder with mixed anxiety and depressed mood: Secondary | ICD-10-CM | POA: Diagnosis not present

## 2017-01-08 ENCOUNTER — Emergency Department (HOSPITAL_COMMUNITY)
Admission: EM | Admit: 2017-01-08 | Discharge: 2017-01-08 | Disposition: A | Discharge status: Home / Self Care | Payer: BLUE CROSS/BLUE SHIELD | Attending: Emergency Medicine | Admitting: Emergency Medicine

## 2017-01-08 ENCOUNTER — Emergency Department (HOSPITAL_COMMUNITY): Payer: BLUE CROSS/BLUE SHIELD

## 2017-01-08 ENCOUNTER — Encounter (HOSPITAL_COMMUNITY): Payer: Self-pay | Admitting: Emergency Medicine

## 2017-01-08 DIAGNOSIS — M79671 Pain in right foot: Secondary | ICD-10-CM | POA: Diagnosis present

## 2017-01-08 DIAGNOSIS — F17200 Nicotine dependence, unspecified, uncomplicated: Secondary | ICD-10-CM | POA: Insufficient documentation

## 2017-01-08 DIAGNOSIS — L729 Follicular cyst of the skin and subcutaneous tissue, unspecified: Secondary | ICD-10-CM | POA: Insufficient documentation

## 2017-01-08 DIAGNOSIS — M79672 Pain in left foot: Secondary | ICD-10-CM

## 2017-01-08 DIAGNOSIS — S99921A Unspecified injury of right foot, initial encounter: Secondary | ICD-10-CM | POA: Diagnosis not present

## 2017-01-08 DIAGNOSIS — M7989 Other specified soft tissue disorders: Secondary | ICD-10-CM

## 2017-01-08 MED ORDER — TRAMADOL HCL 50 MG PO TABS: ORAL_TABLET | ORAL | 0 refills | Status: DC

## 2017-01-08 MED ORDER — IBUPROFEN 800 MG PO TABS: 800.0000 mg | ORAL_TABLET | Freq: Three times a day (TID) | ORAL | 0 refills | Status: DC

## 2017-01-08 MED ORDER — IBUPROFEN 600 MG PO TABS: 600.0000 mg | ORAL_TABLET | Freq: Four times a day (QID) | ORAL | 0 refills | Status: DC

## 2017-01-08 MED ORDER — IBUPROFEN 800 MG PO TABS
800.0000 mg | ORAL_TABLET | Freq: Once | ORAL | Status: AC
  Administered 2017-01-08: 800 mg via ORAL
  Filled 2017-01-08: qty 1

## 2017-01-08 MED ORDER — ACETAMINOPHEN 325 MG PO TABS
650.0000 mg | ORAL_TABLET | Freq: Once | ORAL | Status: AC
  Administered 2017-01-08: 650 mg via ORAL
  Filled 2017-01-08: qty 2

## 2017-01-08 NOTE — ED Provider Notes (Signed)
[facility]-EMER[facility]ENCY DEPT Provider Note   CSN: [id number] Arrival date & time: 01/08/17  [id number]     History   Chief Complaint Chief Complaint  Patient presents with  . Foot Pain    HPI Patric DykesMichael G Egley is a 36 y.o. male.  Patient is a 36 year old male who presents to the emergency department with right foot pain.  The patient states that approximately one month ago he began to have painful sensation at the bottom of his foot. This problem is gotten progressively worse. He now has pain when he walks or stands. The patient states that he does a lot of standing and walking on his job. He's not had any injury to his foot. He does not recall stepping on anything or sustaining any puncture type wounds. He's not had any fever or chills. There's been no increase redness appreciated. He presents now for assessment and assistance with this problem.   The history is provided by the patient.    History reviewed. No pertinent past medical history.  Patient Active Problem List   Diagnosis Date Noted  . Chronic hand pain, right 09/04/2016  . Current smoker 09/04/2016  . Obesity (BMI 30-39.9) 05/14/2016  . Closed fracture of neck of metacarpal bone(s) 01/03/2014    Past Surgical History:  Procedure Laterality Date  . FACIAL FRACTURE SURGERY         Home Medications    Prior to Admission medications   Medication Sig Start Date End Date Taking? Authorizing Provider  ibuprofen (ADVIL,MOTRIN) 800 MG tablet Take 1 tablet (800 mg total) by mouth 3 (three) times daily. 01/08/17   Ivery QualeBryant, Kitt Minardi, PA-C  traMADol Janean Sark(ULTRAM) 50 MG tablet 1 or 2 po q6h prn pain 01/08/17   Ivery QualeBryant, Brailynn Breth, PA-C  Vitamin D, Ergocalciferol, (DRISDOL) 50000 units CAPS capsule Take 1 capsule (50,000 Units total) by mouth every 7 (seven) days. 05/15/16   Junie SpencerHawks, Christy A, FNP    Family History Family History  Problem Relation Age of Onset  . Diabetes Other     Social History Social History  Substance Use Topics  .  Smoking status: Current Every Day Smoker    Packs/day: 1.00  . Smokeless tobacco: Never Used  . Alcohol use Yes     Comment: social     Allergies   Patient has no known allergies.   Review of Systems Review of Systems  Constitutional: Negative for activity change.       All ROS Neg except as noted in HPI  HENT: Negative for nosebleeds.   Eyes: Negative for photophobia and discharge.  Respiratory: Negative for cough, shortness of breath and wheezing.   Cardiovascular: Negative for chest pain and palpitations.  Gastrointestinal: Negative for abdominal pain and blood in stool.  Genitourinary: Negative for dysuria, frequency and hematuria.  Musculoskeletal: Negative for arthralgias, back pain and neck pain.  Skin: Negative.   Neurological: Negative for dizziness, seizures and speech difficulty.  Psychiatric/Behavioral: Negative for confusion and hallucinations.     Physical Exam Updated Vital Signs BP 137/87 (BP Location: Right Arm)   Pulse 82   Temp 97.7 F (36.5 C) (Oral)   Resp 16   SpO2 99%   Physical Exam  Constitutional: He is oriented to person, place, and time. He appears well-developed and well-nourished.  Non-toxic appearance.  HENT:  Head: Normocephalic.  Right Ear: Tympanic membrane and external ear normal.  Left Ear: Tympanic membrane and external ear normal.  Eyes: EOM and lids are normal. Pupils are equal, round, and  reactive to light.  Neck: Normal range of motion. Neck supple. Carotid bruit is not present.  Cardiovascular: Normal rate, regular rhythm, normal heart sounds, intact distal pulses and normal pulses.   Pulmonary/Chest: Breath sounds normal. No respiratory distress.  Abdominal: Soft. Bowel sounds are normal. There is no tenderness. There is no guarding.  Musculoskeletal: Normal range of motion.       Feet:  Lymphadenopathy:       Head (right side): No submandibular adenopathy present.       Head (left side): No submandibular adenopathy  present.    He has no cervical adenopathy.  Neurological: He is alert and oriented to person, place, and time. He has normal strength. No cranial nerve deficit or sensory deficit.  Skin: Skin is warm and dry.  Psychiatric: He has a normal mood and affect. His speech is normal.  Nursing note and vitals reviewed.    ED Treatments / Results  Labs (all labs ordered are listed, but only abnormal results are displayed) Labs Reviewed - No data to display  EKG  EKG Interpretation None       Radiology Dg Foot Complete Right  Result Date: 01/08/2017 CLINICAL DATA:  Not on bottom of right foot for 1 month with pain, initial encounter EXAM: RIGHT FOOT COMPLETE - 3+ VIEW COMPARISON:  None. FINDINGS: There is no evidence of fracture or dislocation. There is no evidence of arthropathy or other focal bone abnormality. Soft tissues are unremarkable. IMPRESSION: No acute abnormality noted. No soft tissue changes to correspond with the patient's given clinical history are note Electronically Signed   By: Alcide Clever M.D.   On: 01/08/2017 12:08    Procedures Procedures (including critical care time) ACE bandage applied by me. After application, no pain or tingling of toes. FROM of toes. No temp changes of the right lower ext. Medications Ordered in ED Medications  ibuprofen (ADVIL,MOTRIN) tablet 800 mg (800 mg Oral Given 01/08/17 1246)  acetaminophen (TYLENOL) tablet 650 mg (650 mg Oral Given 01/08/17 1246)     Initial Impression / Assessment and Plan / ED Course  I have reviewed the triage vital signs and the nursing notes.  Pertinent labs & imaging results that were available during my care of the patient were reviewed by me and considered in my medical decision making (see chart for details).      Final Clinical Impressions(s) / ED Diagnoses MDM Pt Reports 1 month of increasing foot pain. Particularly at the site of the cysts versus callus vs plantar wart. X-ray is negative for fracture,  dislocation, foreign body, or gas.  I've asked patient to soak his foot in warm Epsom salt soaks 1 or 2 times daily. He will be treated with ibuprofen 800 mg 3 times daily. He will use Ultram every 6 hours for more severe pain. I provided an Ace wrap and postoperative shoe for the patient. The patient is referred to Dr. Nolen Mu for podiatry evaluation and management.    Final diagnoses:  Left foot pain  Cyst of soft tissue    New Prescriptions New Prescriptions   IBUPROFEN (ADVIL,MOTRIN) 800 MG TABLET    Take 1 tablet (800 mg total) by mouth 3 (three) times daily.   TRAMADOL (ULTRAM) 50 MG TABLET    1 or 2 po q6h prn pain     Ivery Quale, PA-C 01/08/17 1309    Blane Ohara, MD 01/08/17 1535

## 2017-01-08 NOTE — Discharge Instructions (Signed)
Please soak your foot in warm epsom salt bath 2 times daily for 15min each. Use ace bandage and Post op shoe until seen by podiatry. Call Dr Nolen MuMcKinney as soon as possible for office evaluation. Your xray is negative for fracture, foreign body, or signs of infection. Use 800mg  of ibuprofen with a meal three times daily. Use ultram for more severe pain. This medication may cause drowsiness. Please do not drink, drive, or participate in activity that requires concentration while taking this medication.

## 2017-01-08 NOTE — ED Triage Notes (Signed)
Pt c/o knot on bottom of right foot x 1 month that has been getting more painful. No visible knot noted but can feel lima bean size swelling.

## 2017-01-13 DIAGNOSIS — F4323 Adjustment disorder with mixed anxiety and depressed mood: Secondary | ICD-10-CM | POA: Diagnosis not present

## 2017-01-13 DIAGNOSIS — F4325 Adjustment disorder with mixed disturbance of emotions and conduct: Secondary | ICD-10-CM | POA: Diagnosis not present

## 2017-01-19 DIAGNOSIS — F4325 Adjustment disorder with mixed disturbance of emotions and conduct: Secondary | ICD-10-CM | POA: Diagnosis not present

## 2017-02-04 DIAGNOSIS — M79671 Pain in right foot: Secondary | ICD-10-CM | POA: Diagnosis not present

## 2017-02-04 DIAGNOSIS — M722 Plantar fascial fibromatosis: Secondary | ICD-10-CM | POA: Diagnosis not present

## 2017-05-26 DIAGNOSIS — F4325 Adjustment disorder with mixed disturbance of emotions and conduct: Secondary | ICD-10-CM | POA: Diagnosis not present

## 2018-02-25 ENCOUNTER — Emergency Department (HOSPITAL_COMMUNITY): Payer: BLUE CROSS/BLUE SHIELD

## 2018-02-25 ENCOUNTER — Emergency Department (HOSPITAL_COMMUNITY)
Admission: EM | Admit: 2018-02-25 | Discharge: 2018-02-25 | Disposition: A | Discharge status: Home / Self Care | Payer: BLUE CROSS/BLUE SHIELD | Attending: Emergency Medicine | Admitting: Emergency Medicine

## 2018-02-25 ENCOUNTER — Encounter (HOSPITAL_COMMUNITY): Payer: Self-pay | Admitting: *Deleted

## 2018-02-25 ENCOUNTER — Other Ambulatory Visit: Payer: Self-pay

## 2018-02-25 DIAGNOSIS — R072 Precordial pain: Secondary | ICD-10-CM | POA: Diagnosis not present

## 2018-02-25 DIAGNOSIS — F17210 Nicotine dependence, cigarettes, uncomplicated: Secondary | ICD-10-CM | POA: Diagnosis not present

## 2018-02-25 DIAGNOSIS — R079 Chest pain, unspecified: Secondary | ICD-10-CM | POA: Diagnosis not present

## 2018-02-25 DIAGNOSIS — R0789 Other chest pain: Secondary | ICD-10-CM | POA: Diagnosis not present

## 2018-02-25 LAB — CBC
HCT: 42.3 % (ref 39.0–52.0)
Hemoglobin: 14.4 g/dL (ref 13.0–17.0)
MCH: 29.2 pg (ref 26.0–34.0)
MCHC: 34 g/dL (ref 30.0–36.0)
MCV: 85.8 fL (ref 78.0–100.0)
PLATELETS: 342 10*3/uL (ref 150–400)
RBC: 4.93 MIL/uL (ref 4.22–5.81)
RDW: 13.4 % (ref 11.5–15.5)
WBC: 11.4 10*3/uL — ABNORMAL HIGH (ref 4.0–10.5)

## 2018-02-25 LAB — BASIC METABOLIC PANEL
Anion gap: 11 (ref 5–15)
BUN: 13 mg/dL (ref 6–20)
CO2: 24 mmol/L (ref 22–32)
CREATININE: 1.06 mg/dL (ref 0.61–1.24)
Calcium: 9.9 mg/dL (ref 8.9–10.3)
Chloride: 108 mmol/L (ref 98–111)
Glucose, Bld: 104 mg/dL — ABNORMAL HIGH (ref 70–99)
POTASSIUM: 3.8 mmol/L (ref 3.5–5.1)
Sodium: 143 mmol/L (ref 135–145)

## 2018-02-25 LAB — TROPONIN I: Troponin I: <0.03 ng/mL (ref <0.03)

## 2018-02-25 MED ORDER — GI COCKTAIL ~~LOC~~
30.0000 mL | Freq: Once | ORAL | Status: AC
  Administered 2018-02-25: 30 mL via ORAL
  Filled 2018-02-25: qty 30

## 2018-02-25 NOTE — ED Provider Notes (Signed)
[facility] EMERGENCY DEPARTMENT Provider Note   CSN: [id number] Arrival date & time: 02/25/18  1146     History   Chief Complaint Chief Complaint  Patient presents with  . Chest Pain    HPI Eddie Johns is a 37 y.o. male.  Has some chest pain he describes as feeling like he has a hose in his chest with water leaking out.  Cannot describe it any other way.  Is not sharp, pressure, elephant sitting on his chest or pleuritic. No trauma. No obvious relationship to eating, drinking or other activities.   Chest Pain   This is a chronic problem. The current episode started more than 1 week ago. The problem occurs every several days. The problem has not changed since onset.The pain is present in the substernal region. The quality of the pain is described as brief and sharp. The pain does not radiate.    History reviewed. No pertinent past medical history.  Patient Active Problem List   Diagnosis Date Noted  . Chronic hand pain, right 09/04/2016  . Current smoker 09/04/2016  . Obesity (BMI 30-39.9) 05/14/2016  . Closed fracture of neck of metacarpal bone(s) 01/03/2014    Past Surgical History:  Procedure Laterality Date  . FACIAL FRACTURE SURGERY          Home Medications    Prior to Admission medications   Not on File    Family History Family History  Problem Relation Age of Onset  . Diabetes Other     Social History Social History   Tobacco Use  . Smoking status: Current Every Day Smoker    Packs/day: 1.00  . Smokeless tobacco: Never Used  Substance Use Topics  . Alcohol use: Yes    Comment: social  . Drug use: Yes    Types: Marijuana, Cocaine     Allergies   Shellfish allergy   Review of Systems Review of Systems  Cardiovascular: Positive for chest pain.  All other systems reviewed and are negative.    Physical Exam Updated Vital Signs BP 100/77   Temp 98.8 F (37.1 C)   Resp (!) 29   Ht 5\' 6"  (1.676 m)   Wt 77.1 kg (170 lb)    SpO2 99%   BMI 27.44 kg/m   Physical Exam  Constitutional: He appears well-developed and well-nourished.  HENT:  Head: Normocephalic and atraumatic.  Eyes: Conjunctivae and EOM are normal.  Neck: Normal range of motion.  Cardiovascular: Normal rate.  Pulmonary/Chest: Effort normal. No respiratory distress. He exhibits no tenderness.  Abdominal: Soft. He exhibits no distension.  Musculoskeletal: Normal range of motion.  Neurological: He is alert.  Skin: Skin is warm and dry.  Nursing note and vitals reviewed.    ED Treatments / Results  Labs (all labs ordered are listed, but only abnormal results are displayed) Labs Reviewed  BASIC METABOLIC PANEL - Abnormal; Notable for the following components:      Result Value   Glucose, Bld 104 (*)    All other components within normal limits  CBC - Abnormal; Notable for the following components:   WBC 11.4 (*)    All other components within normal limits  TROPONIN I    EKG EKG Interpretation  Date/Time:  Friday February 25 2018 11:58:46 EDT Ventricular Rate:  87 PR Interval:  156 QRS Duration: 86 QT Interval:  328 QTC Calculation: 394 R Axis:   50 Text Interpretation:  Normal sinus rhythm Normal ECG No old tracing to compare  Confirmed by Mancel Bale [phone]) on 02/25/2018 2:18:03 PM   Radiology Dg Chest 2 View  Result Date: 02/25/2018 CLINICAL DATA:  Intermittent chest pain over the last 3 years. EXAM: CHEST - 2 VIEW COMPARISON:  None. FINDINGS: Heart size is normal. Mediastinal shadows are normal. The lungs are clear. No bronchial thickening. No infiltrate, mass, effusion or collapse. Pulmonary vascularity is normal. No bony abnormality. IMPRESSION: Normal chest Electronically Signed   By: Paulina Fusi M.D.   On: 02/25/2018 12:28    Procedures Procedures (including critical care time)  Medications Ordered in ED Medications  gi cocktail (Maalox,Lidocaine,Donnatal) (30 mLs Oral Given 02/25/18 1426)     Initial Impression /  Assessment and Plan / ED Course  I have reviewed the triage vital signs and the nursing notes.  Pertinent labs & imaging results that were available during my care of the patient were reviewed by me and considered in my medical decision making (see chart for details).     Chest pain on and off again for the last 3 years.  Unclear etiology at this point.  Will follow with his primary doctor.  Doubt ACS or PE.  No evidence for emergent cause at this time.  Final Clinical Impressions(s) / ED Diagnoses   Final diagnoses:  Nonspecific chest pain    ED Discharge Orders    None       Wiktoria Hemrick, Barbara Cower, MD 02/25/18 1524

## 2018-02-25 NOTE — ED Triage Notes (Signed)
Patient states chest discomfort for 3 years, "comes and goes" for years states wants to get checked out.

## 2019-03-02 IMAGING — DX DG FOOT COMPLETE 3+V*R*
3 series · 3 of 3 positions shown · non-contrast
Comparison: None.

CLINICAL DATA: Not on bottom of right foot for 1 month with pain,
initial encounter

EXAM:
RIGHT FOOT COMPLETE - 3+ VIEW

[foot ap]
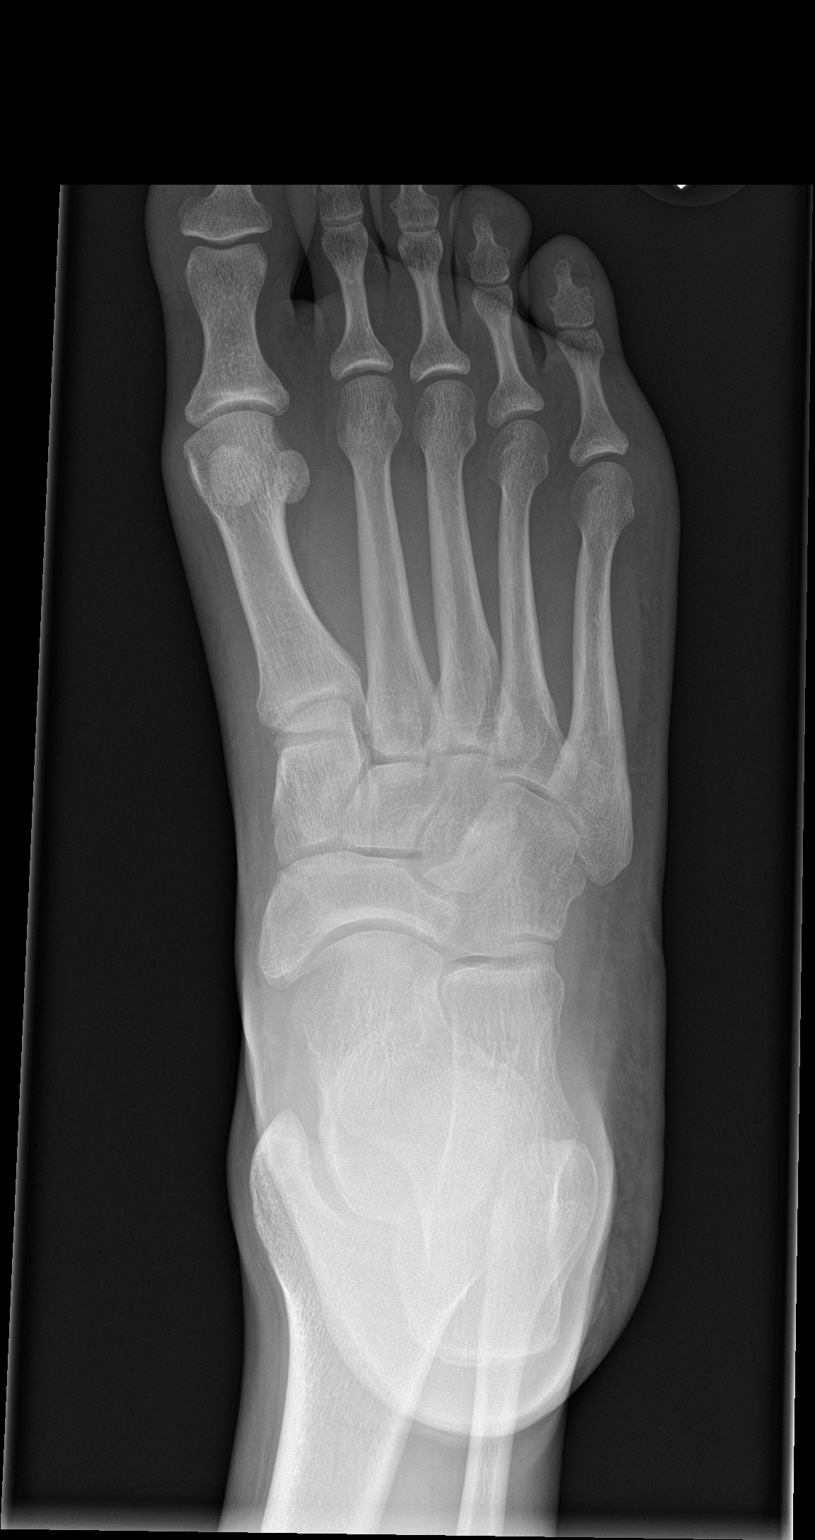

[foot obl]
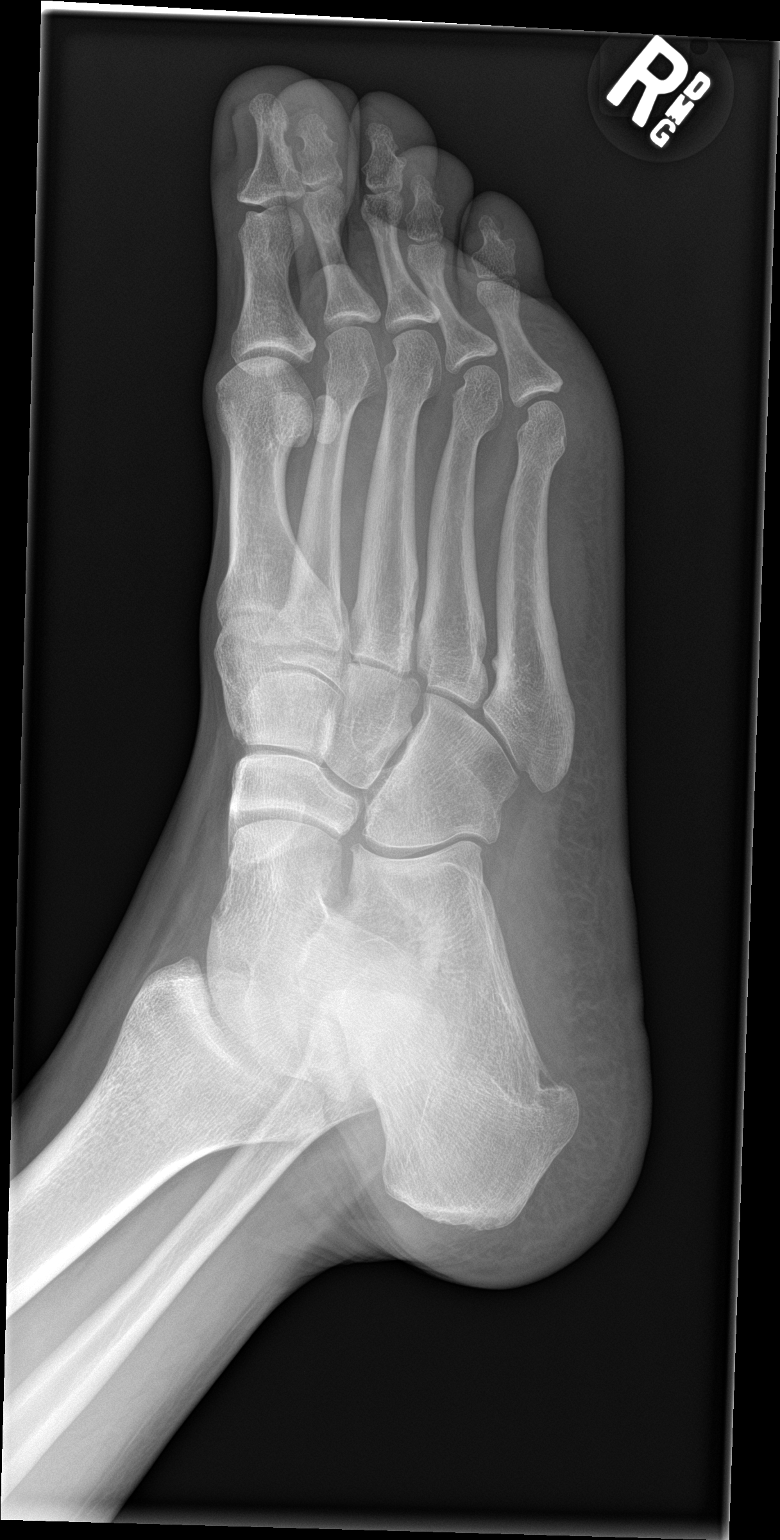

[foot lat]
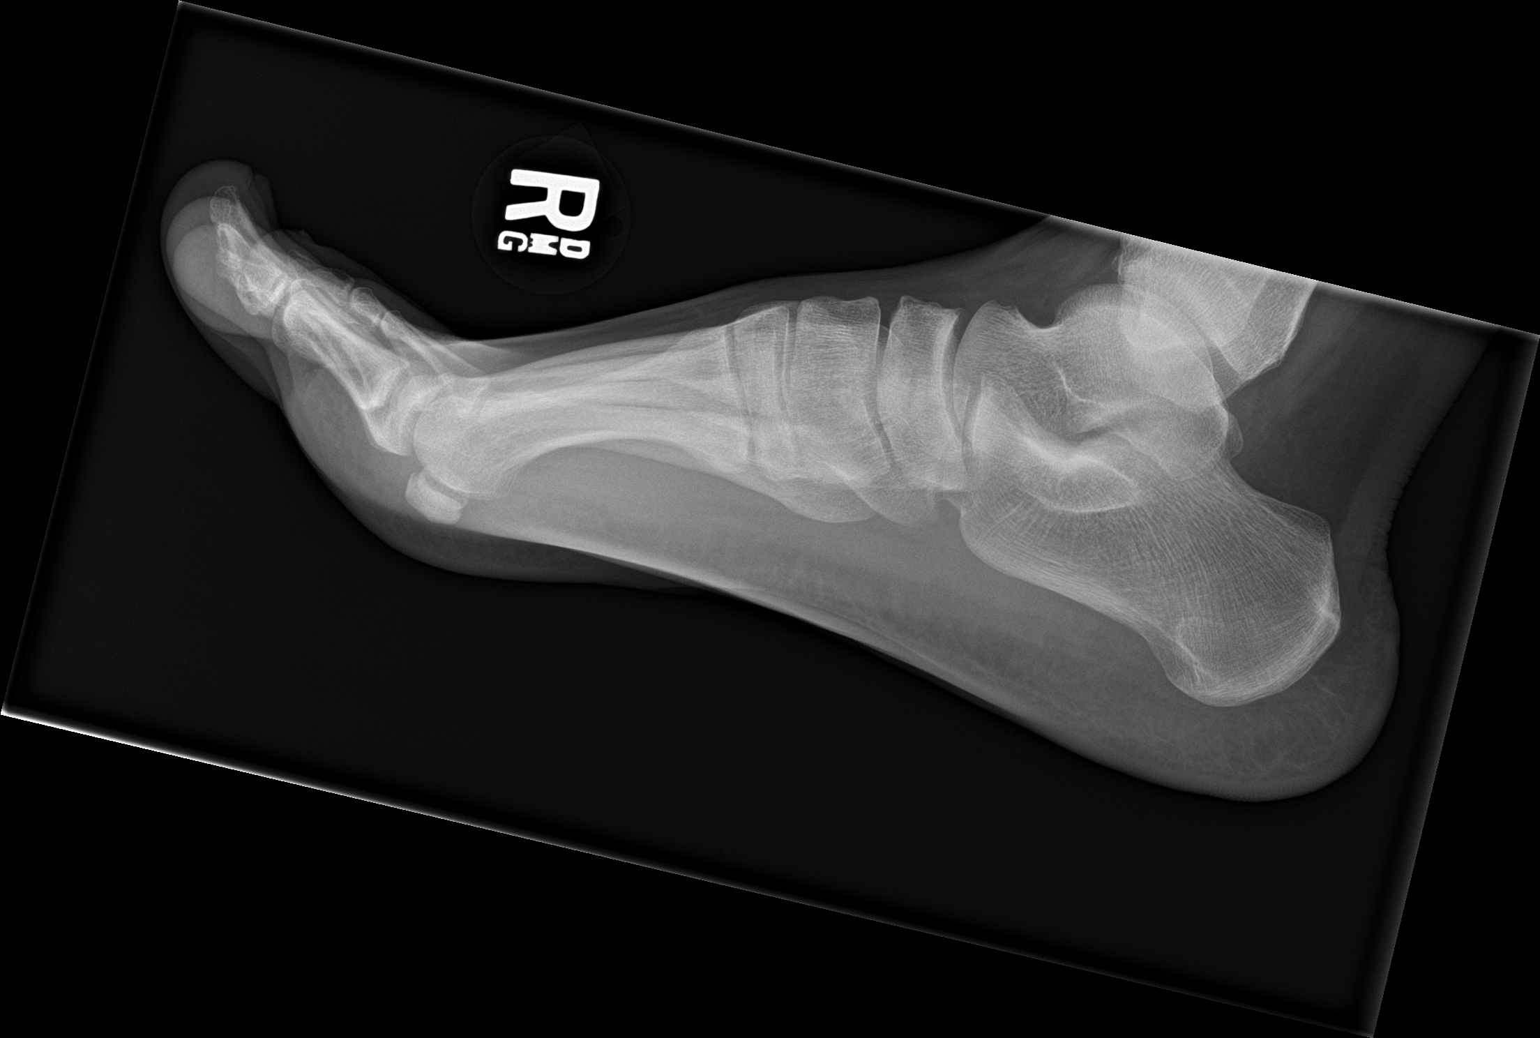

[3 of 3 positions shown; findings below may reference images not displayed]

FINDINGS: There is no evidence of fracture or dislocation. There is no
evidence of arthropathy or other focal bone abnormality. Soft
tissues are unremarkable.
IMPRESSION: No acute abnormality noted. No soft tissue changes to correspond
with the patient's given clinical history are note

## 2020-04-18 IMAGING — DX DG CHEST 2V
2 series · 2 of 2 positions shown · non-contrast
Comparison: None.

CLINICAL DATA: Intermittent chest pain over the last 3 years.

EXAM:
CHEST - 2 VIEW

[chest pa]
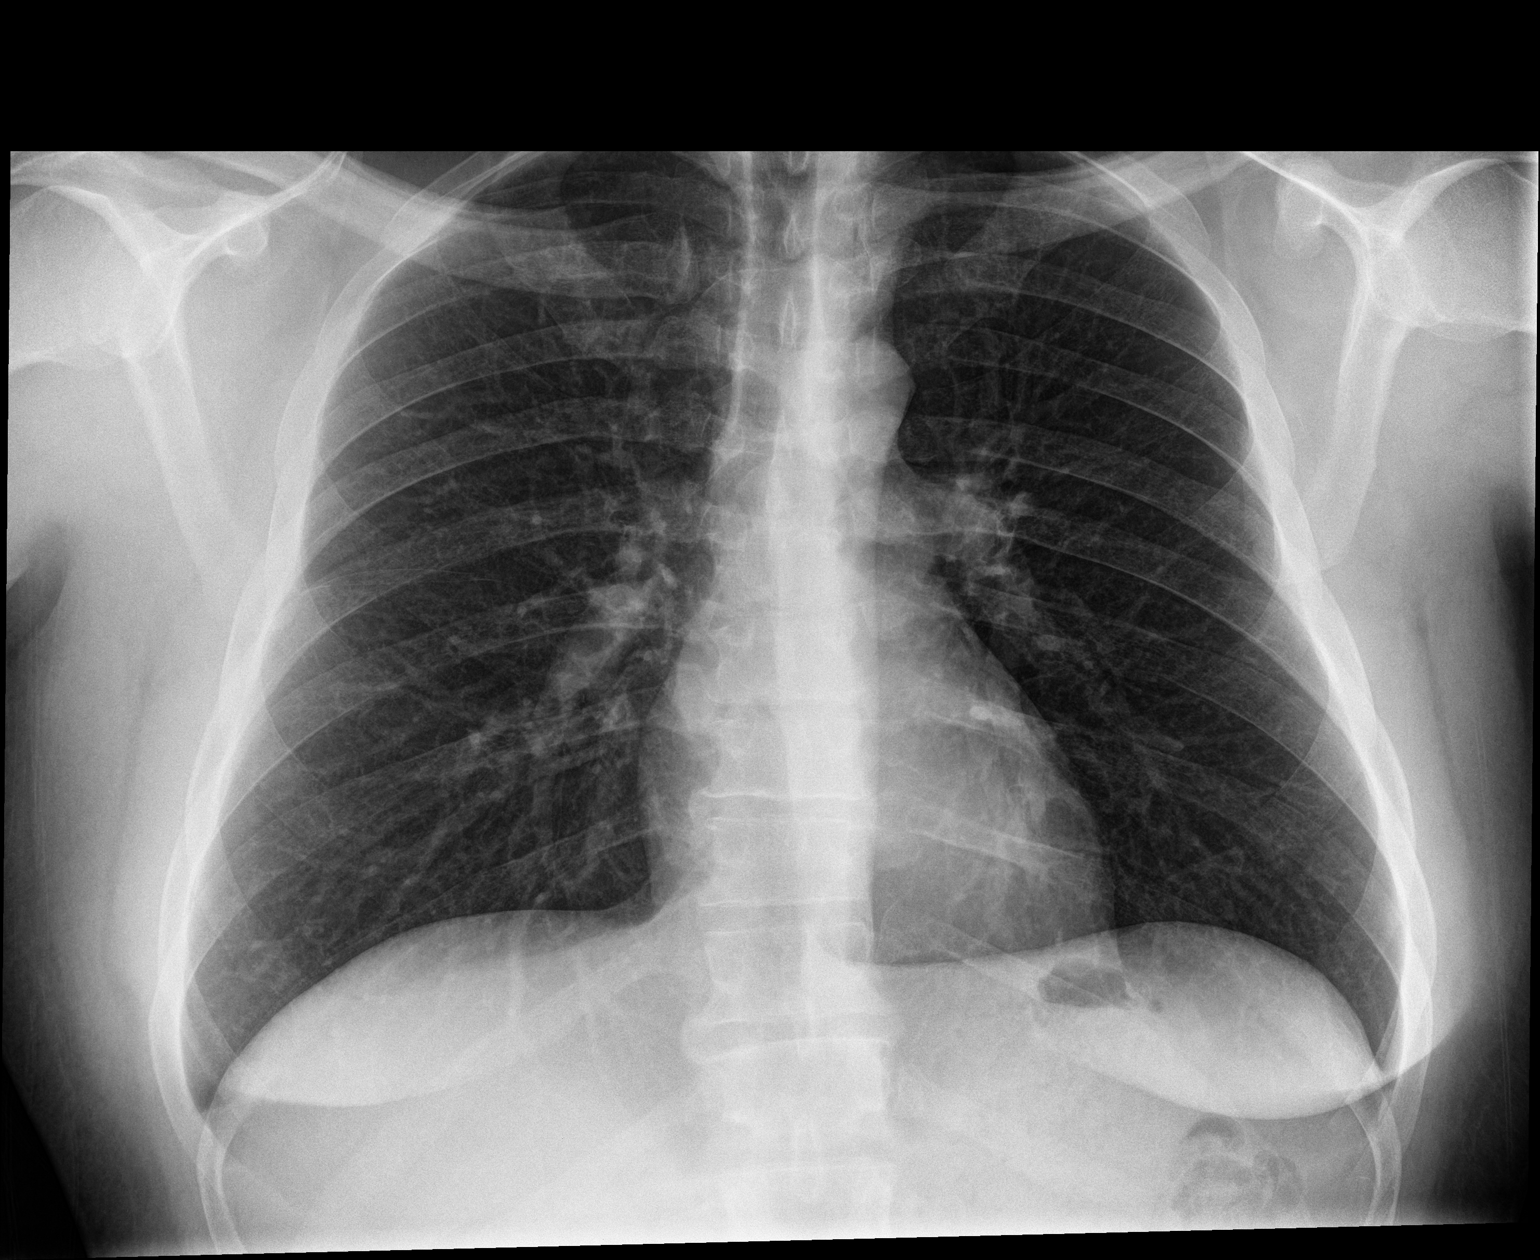

[chest lat]
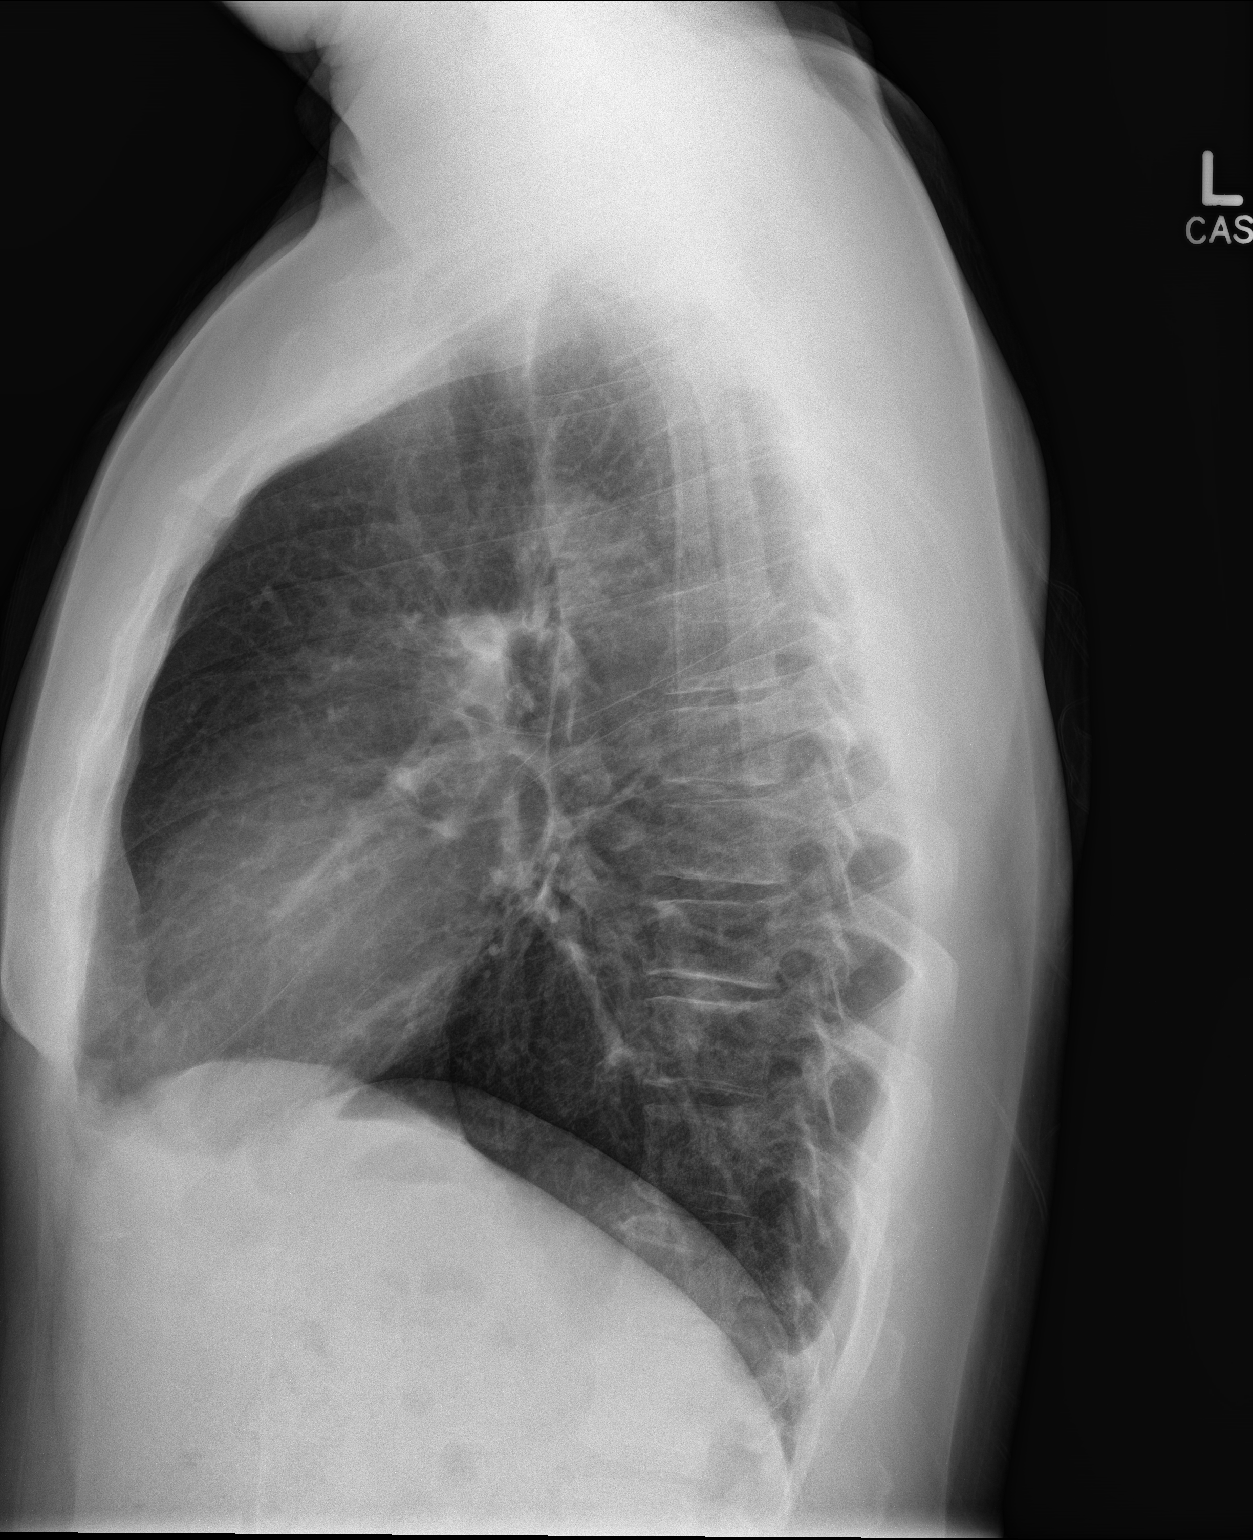

[2 of 2 positions shown; findings below may reference images not displayed]

FINDINGS: Heart size is normal. Mediastinal shadows are normal. The lungs are
clear. No bronchial thickening. No infiltrate, mass, effusion or
collapse. Pulmonary vascularity is normal. No bony abnormality.
IMPRESSION: Normal chest
